# Patient Record
Sex: Female | Born: 1995 | Race: White | Hispanic: No | Marital: Single | Smoking: Current some day smoker
Health system: Southern US, Community
[De-identification: ages and names within clinical notes are randomized; demographics above are authoritative.]

## PROBLEM LIST (undated history)

## (undated) DIAGNOSIS — F4310 Post-traumatic stress disorder, unspecified: Secondary | ICD-10-CM

---

## 2016-07-18 ENCOUNTER — Ambulatory Visit (HOSPITAL_COMMUNITY)
Admission: EM | Admit: 2016-07-18 | Discharge: 2016-07-18 | Disposition: A | Discharge status: Home / Self Care | Attending: Family Medicine | Admitting: Family Medicine

## 2016-07-18 ENCOUNTER — Encounter (HOSPITAL_COMMUNITY): Payer: Self-pay | Admitting: Emergency Medicine

## 2016-07-18 DIAGNOSIS — B9689 Other specified bacterial agents as the cause of diseases classified elsewhere: Secondary | ICD-10-CM

## 2016-07-18 DIAGNOSIS — N760 Acute vaginitis: Secondary | ICD-10-CM | POA: Insufficient documentation

## 2016-07-18 DIAGNOSIS — Z79899 Other long term (current) drug therapy: Secondary | ICD-10-CM | POA: Insufficient documentation

## 2016-07-18 DIAGNOSIS — N898 Other specified noninflammatory disorders of vagina: Secondary | ICD-10-CM

## 2016-07-18 DIAGNOSIS — R21 Rash and other nonspecific skin eruption: Secondary | ICD-10-CM | POA: Diagnosis present

## 2016-07-18 DIAGNOSIS — F17200 Nicotine dependence, unspecified, uncomplicated: Secondary | ICD-10-CM | POA: Diagnosis not present

## 2016-07-18 LAB — POCT URINALYSIS DIP (DEVICE)
Bilirubin Urine: NEGATIVE
GLUCOSE, UA: NEGATIVE mg/dL
Ketones, ur: NEGATIVE mg/dL
Nitrite: NEGATIVE
PROTEIN: 30 mg/dL — AB
SPECIFIC GRAVITY, URINE: 1.015 (ref 1.005–1.030)
UROBILINOGEN UA: 0.2 mg/dL (ref 0.0–1.0)
pH: >=9 (ref 5.0–8.0)

## 2016-07-18 LAB — POCT PREGNANCY, URINE: PREG TEST UR: NEGATIVE

## 2016-07-18 MED ORDER — AZITHROMYCIN 250 MG PO TABS
ORAL_TABLET | ORAL | Status: AC
  Filled 2016-07-18: qty 4

## 2016-07-18 MED ORDER — VALACYCLOVIR HCL 1 G PO TABS: 1000.0000 mg | ORAL_TABLET | Freq: Two times a day (BID) | ORAL | 0 refills | Status: DC

## 2016-07-18 MED ORDER — AZITHROMYCIN 250 MG PO TABS
1000.0000 mg | ORAL_TABLET | Freq: Once | ORAL | Status: AC
  Administered 2016-07-18: 1000 mg via ORAL

## 2016-07-18 MED ORDER — METRONIDAZOLE 500 MG PO TABS: 500.0000 mg | ORAL_TABLET | Freq: Two times a day (BID) | ORAL | 0 refills | Status: DC

## 2016-07-18 MED ORDER — PHENAZOPYRIDINE HCL 200 MG PO TABS: 200.0000 mg | ORAL_TABLET | Freq: Three times a day (TID) | ORAL | 0 refills | Status: DC | PRN

## 2016-07-18 MED ORDER — CEFTRIAXONE SODIUM 250 MG IJ SOLR
INTRAMUSCULAR | Status: AC
  Filled 2016-07-18: qty 250

## 2016-07-18 MED ORDER — CEFTRIAXONE SODIUM 250 MG IJ SOLR
250.0000 mg | Freq: Once | INTRAMUSCULAR | Status: AC
Start: 2016-07-18 — End: 2016-07-18
  Administered 2016-07-18: 250 mg via INTRAMUSCULAR

## 2016-07-18 NOTE — Discharge Instructions (Signed)
You are being tested today for herpes, gonorrhea, chlamydia, bacterial vaginosis, and yeast.  I would also recommend you have HIV screening at some point in the near future. You are being treated today based on your symptoms for pelvic infections. You have received an injection of 250 mg of Rocephin, 1 gram of Azithromycin. I have started you on an antiviral medication called valacyclovir, take 1 tablet twice a day, I have also started a medication called metronidazole, take 1 tablet twice a day for 7 days. Do not drink any alcohol while taking this medicine as it will make you very ill.  I have provided the name of an OB/GYN, I would recommend calling and scheduling an appointment in 1 week for follow up care. You will be notified of your test results in 48-96 hours.

## 2016-07-18 NOTE — ED Triage Notes (Signed)
Pt states she noted bumps on her external vaginal area a few days ago.  She states they are very painful. Pt reports exterior vaginal burning with urination.  Pt states she was having internal vaginal pain and was concerned for a yeast infection so she bought an OTC yeast medication that she inserted into her vagina.  The internal pain went away and then she developed the bumps.  Pt denies any fever.  She reports the same partner for the last 9 months.

## 2016-07-18 NOTE — ED Provider Notes (Signed)
CSN: [id number]     Arrival date & time 07/18/16  1102 History   None    Chief Complaint  Patient presents with  . Rash   (Consider location/radiation/quality/duration/timing/severity/associated sxs/prior Treatment) 21 year old female presents to clinic with chief complain of vaginal pain and discharge, she reports she had discharge for several days, treated herself for yeast infection without success. She is sexually active, does not use protection, no nausea, no fever, no flank pain, does have pelvic pain.   The history is provided by the patient.    History reviewed. No pertinent past medical history. History reviewed. No pertinent surgical history. History reviewed. No pertinent family history. Social History  Substance Use Topics  . Smoking status: Current Some Day Smoker  . Smokeless tobacco: Never Used  . Alcohol use No   OB History    No data available     Review of Systems  Reason unable to perform ROS: as covered in HPI.  All other systems reviewed and are negative.   Allergies  Patient has no known allergies.  Home Medications   Prior to Admission medications   Medication Sig Start Date End Date Taking? Authorizing Provider  etonogestrel (NEXPLANON) 68 MG IMPL implant 1 each by Subdermal route once.   Yes Historical Provider, MD  metroNIDAZOLE (FLAGYL) 500 MG tablet Take 1 tablet (500 mg total) by mouth 2 (two) times daily. 07/18/16   Dorena Bodo, NP  phenazopyridine (PYRIDIUM) 200 MG tablet Take 1 tablet (200 mg total) by mouth 3 (three) times daily as needed for pain. 07/18/16   Dorena Bodo, NP  valACYclovir (VALTREX) 1000 MG tablet Take 1 tablet (1,000 mg total) by mouth 2 (two) times daily. 07/18/16   Dorena Bodo, NP   Meds Ordered and Administered this Visit   Medications  cefTRIAXone (ROCEPHIN) injection 250 mg (not administered)  azithromycin (ZITHROMAX) tablet 1,000 mg (not administered)    BP 122/71 (BP Location: Right Arm)   Pulse  68   Temp 97.1 F (36.2 C) (Oral)   LMP 07/04/2016 (Approximate)   SpO2 100%  No data found.   Physical Exam  Constitutional: She appears well-developed and well-nourished. She appears distressed.  Abdominal: Bowel sounds are normal. There is no tenderness. There is no rigidity, no rebound, no guarding and no CVA tenderness.  Genitourinary: Pelvic exam was performed with patient prone. There is rash, tenderness and lesion on the right labia. There is rash, tenderness and lesion on the left labia. There is erythema and tenderness in the vagina. No bleeding in the vagina. No foreign body in the vagina. No signs of injury around the vagina. Vaginal discharge found.  Genitourinary Comments: Multiple red, vesicular lesions, unable to fully advance the speculum do to pain, manual exam deferred  Skin: She is not diaphoretic.  Nursing note and vitals reviewed.   Urgent Care Course     Procedures (including critical care time)  Labs Review Labs Reviewed  POCT URINALYSIS DIP (DEVICE) - Abnormal; Notable for the following:       Result Value   Hgb urine dipstick TRACE (*)    Protein, ur 30 (*)    Leukocytes, UA MODERATE (*)    All other components within normal limits  HSV CULTURE AND TYPING  CERVICOVAGINAL ANCILLARY ONLY    Imaging Review No results found.   Visual Acuity Review  Right Eye Distance:   Left Eye Distance:   Bilateral Distance:    Right Eye Near:   Left Eye Near:  Bilateral Near:         MDM   1. BV (bacterial vaginosis)   2. Vaginal lesion   You are being tested today for herpes, gonorrhea, chlamydia, bacterial vaginosis, and yeast.  I would also recommend you have HIV screening at some point in the near future. You are being treated today based on your symptoms for pelvic infections. You have received an injection of 250 mg of Rocephin, 1 gram of Azithromycin. I have started you on an antiviral medication called valacyclovir, take 1 tablet twice a day,  I have also started a medication called metronidazole, take 1 tablet twice a day for 7 days. Do not drink any alcohol while taking this medicine as it will make you very ill.  I have provided the name of an OB/GYN, I would recommend calling and scheduling an appointment in 1 week for follow up care. You will be notified of your test results in 48-96 hours.      Dorena BodoLawrence Arora Coakley, NP 07/18/16 2001

## 2016-07-19 LAB — CERVICOVAGINAL ANCILLARY ONLY
Chlamydia: NEGATIVE
NEISSERIA GONORRHEA: NEGATIVE
WET PREP (BD AFFIRM): NEGATIVE

## 2016-07-20 LAB — HSV CULTURE AND TYPING

## 2019-03-21 ENCOUNTER — Other Ambulatory Visit: Payer: Self-pay

## 2019-03-21 ENCOUNTER — Encounter (HOSPITAL_COMMUNITY): Payer: Self-pay | Admitting: Emergency Medicine

## 2019-03-21 DIAGNOSIS — Y92410 Unspecified street and highway as the place of occurrence of the external cause: Secondary | ICD-10-CM | POA: Insufficient documentation

## 2019-03-21 DIAGNOSIS — M25552 Pain in left hip: Secondary | ICD-10-CM | POA: Diagnosis not present

## 2019-03-21 DIAGNOSIS — F17200 Nicotine dependence, unspecified, uncomplicated: Secondary | ICD-10-CM | POA: Insufficient documentation

## 2019-03-21 DIAGNOSIS — Z79899 Other long term (current) drug therapy: Secondary | ICD-10-CM | POA: Diagnosis not present

## 2019-03-21 DIAGNOSIS — Y9389 Activity, other specified: Secondary | ICD-10-CM | POA: Diagnosis not present

## 2019-03-21 DIAGNOSIS — S3992XA Unspecified injury of lower back, initial encounter: Secondary | ICD-10-CM | POA: Diagnosis present

## 2019-03-21 DIAGNOSIS — M5442 Lumbago with sciatica, left side: Secondary | ICD-10-CM | POA: Insufficient documentation

## 2019-03-21 DIAGNOSIS — Y999 Unspecified external cause status: Secondary | ICD-10-CM | POA: Diagnosis not present
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
## 2019-03-22 ENCOUNTER — Emergency Department (HOSPITAL_COMMUNITY): Payer: No Typology Code available for payment source

## 2019-03-22 ENCOUNTER — Emergency Department (HOSPITAL_COMMUNITY)
Admission: EM | Admit: 2019-03-22 | Discharge: 2019-03-22 | Disposition: A | Discharge status: Home / Self Care | Payer: No Typology Code available for payment source | Attending: Emergency Medicine | Admitting: Emergency Medicine

## 2019-03-22 DIAGNOSIS — M25552 Pain in left hip: Secondary | ICD-10-CM

## 2019-03-22 DIAGNOSIS — M5442 Lumbago with sciatica, left side: Secondary | ICD-10-CM

## 2019-03-22 HISTORY — DX: Post-traumatic stress disorder, unspecified: F43.10

## 2019-03-22 LAB — PREGNANCY, URINE: Preg Test, Ur: NEGATIVE

## 2019-03-22 MED ORDER — CYCLOBENZAPRINE HCL 10 MG PO TABS
10.0000 mg | ORAL_TABLET | Freq: Once | ORAL | Status: AC
  Administered 2019-03-22: 05:00:00 10 mg via ORAL
  Filled 2019-03-22: qty 1

## 2019-03-22 MED ORDER — IBUPROFEN 200 MG PO TABS
600.0000 mg | ORAL_TABLET | Freq: Once | ORAL | Status: AC
  Administered 2019-03-22: 600 mg via ORAL
  Filled 2019-03-22: qty 3

## 2019-03-22 MED ORDER — CYCLOBENZAPRINE HCL 10 MG PO TABS: 10.0000 mg | ORAL_TABLET | Freq: Two times a day (BID) | ORAL | 0 refills | Status: AC | PRN

## 2019-03-22 NOTE — ED Notes (Signed)
Spoke with lab about the pregnancy test, "they will start processing it now".
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
